# Patient Record
Sex: Female | Born: 2005 | Race: Black or African American | Hispanic: No | Marital: Single | Smoking: Never smoker
Health system: Southern US, Community
[De-identification: ages and names within clinical notes are randomized; demographics above are authoritative.]

## PROBLEM LIST (undated history)

## (undated) DIAGNOSIS — J45909 Unspecified asthma, uncomplicated: Secondary | ICD-10-CM

---
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
## 2023-06-08 ENCOUNTER — Encounter: Payer: Self-pay | Admitting: Certified Nurse Midwife

## 2023-06-08 ENCOUNTER — Ambulatory Visit: Admitting: Certified Nurse Midwife

## 2023-06-08 ENCOUNTER — Other Ambulatory Visit (HOSPITAL_COMMUNITY)
Admission: RE | Admit: 2023-06-08 | Discharge: 2023-06-08 | Disposition: A | Discharge status: Home / Self Care | Source: Ambulatory Visit | Attending: Certified Nurse Midwife | Admitting: Certified Nurse Midwife

## 2023-06-08 VITALS — BP 127/91 | HR 90 | Ht 64.0 in | Wt 216.0 lb

## 2023-06-08 DIAGNOSIS — Z01419 Encounter for gynecological examination (general) (routine) without abnormal findings: Secondary | ICD-10-CM

## 2023-06-08 DIAGNOSIS — Z113 Encounter for screening for infections with a predominantly sexual mode of transmission: Secondary | ICD-10-CM

## 2023-06-08 DIAGNOSIS — Z3009 Encounter for other general counseling and advice on contraception: Secondary | ICD-10-CM | POA: Diagnosis not present

## 2023-06-08 DIAGNOSIS — Z803 Family history of malignant neoplasm of breast: Secondary | ICD-10-CM

## 2023-06-08 DIAGNOSIS — Z7189 Other specified counseling: Secondary | ICD-10-CM | POA: Diagnosis not present

## 2023-06-08 NOTE — Progress Notes (Unsigned)
 HX OC  (Loestrin) use but not a constant user. Wants to discuss options. Wants STI testing today. Only swab not blood work. Will do self swab.

## 2023-06-09 ENCOUNTER — Telehealth: Payer: Self-pay | Admitting: Licensed Clinical Social Worker

## 2023-06-09 DIAGNOSIS — Z803 Family history of malignant neoplasm of breast: Secondary | ICD-10-CM | POA: Insufficient documentation

## 2023-06-09 NOTE — Telephone Encounter (Signed)
 [facility] contacted patient on this date to schedule [facility] appointment.Aaron Aas [facility] left a message.

## 2023-06-09 NOTE — Progress Notes (Signed)
 GYNECOLOGY OFFICE VISIT NOTE  History:    Casey Roth is a 18 y.o. G0P0000 here today for New GYN visit and discuss Birth control options. Patient is currently a Consulting civil engineer at [employer]. She is currently sexually active. Her menstrual period is ~21-25 days with a 5 day bleed. She reports using pads and tampons as her collection method and reports going through 5-7 per day. She endorses using Midol  with some relief for cramping during her cycles.  She denies problems with anemia previously. She reports previous use of the pill, but states that she was unable to take it consistently. She request STD testing today. She denies any abnormal vaginal discharge, bleeding, pelvic pain or other concerns.    Past Medical History:  Diagnosis Date   Asthma     History reviewed. No pertinent surgical history.  The following portions of the patient's history were reviewed and updated as appropriate: allergies, current medications, past family history, past medical history, past social history, past surgical history and problem list.   Health Maintenance:  Patient is not at recommended PAP smear age today. Plan to complete at age 61.  Review of Systems:  Pertinent items noted in HPI and remainder of comprehensive ROS otherwise negative.  Physical Exam:  BP (!) 127/91   Pulse 90   Ht 5\' 4"  (1.626 m)   Wt 216 lb (98 kg)   LMP 05/19/2023 (Exact Date)   BMI 37.08 kg/m  CONSTITUTIONAL: Well-developed, well-nourished female in no acute distress.  HEENT:  Normocephalic, atraumatic. External right and left ear normal. No scleral icterus.  NECK: Normal range of motion, supple, no masses noted on observation SKIN: No rash noted. Not diaphoretic. No erythema. No pallor. MUSCULOSKELETAL: Normal range of motion. No edema noted. NEUROLOGIC: Alert and oriented to person, place, and time. Normal muscle tone coordination. No cranial nerve deficit noted. PSYCHIATRIC: Normal mood and affect. Normal  behavior. Normal judgment and thought content. CARDIOVASCULAR: Normal heart rate noted RESPIRATORY: Effort and breath sounds normal, no problems with respiration noted ABDOMEN: No masses noted. No other overt distention noted.   PELVIC: Deferred  Labs and Imaging No results found for this or any previous visit (from the past week). No results found.    Assessment and Plan:    1. Encounter for gynecological examination without abnormal finding - Patient overall doing well.   2. Birth control counseling - Requesting birth control that she does not have to think about daily.  - Reviewed R/B/A of  LARC options.  - Through shared decision making patient and provider agree that a Nexplanon may be the best option.    3. Screening examination for STD (sexually transmitted disease) (Primary) - STD screening performed at patient request  - Cervicovaginal ancillary only( [place])  4. Encounter for breast self examination education - Discussed the importance of SBE. Patient reports breast cancer in paternal grandmother prior to the age of 104.  - Recommended SBE and discussed when and how to perform them at home.    Routine preventative health maintenance measures emphasized. Please refer to After Visit Summary for other counseling recommendations.   Return in about 1 week (around 06/15/2023) for Nexplanon insertion .    I spent 30 minutes dedicated to the care of this patient including pre-visit review of records, face to face time with the patient discussing her conditions and treatments and post visit orders.    Dereck Agerton Maurie Southern) Marlys Singh, MSN, CNM  [facility]  06/09/23 11:57  AM

## 2023-06-10 LAB — CERVICOVAGINAL ANCILLARY ONLY
Bacterial Vaginitis (gardnerella): NEGATIVE
Chlamydia: NEGATIVE
Comment: NEGATIVE
Comment: NEGATIVE
Comment: NEGATIVE
Comment: NORMAL
Neisseria Gonorrhea: NEGATIVE
Trichomonas: NEGATIVE

## 2023-07-09 ENCOUNTER — Ambulatory Visit: Admitting: Obstetrics and Gynecology

## 2023-07-30 IMAGING — CT CT HEAD W/O CM
4 series · 16 of 47 positions shown, 18 images · non-contrast
Comparison: None.

CLINICAL DATA: Head trauma, altered mental status (Ped 0-17y).

EXAM:
CT HEAD WITHOUT CONTRAST
TECHNIQUE: Contiguous axial images were obtained from the base of the skull
through the vertex without intravenous contrast.

[Series 2: head bone · axial · 0.43mm/px · z∈[-70,-38]mm · 3 of 80 slices shown]
[im 8/80  bone]
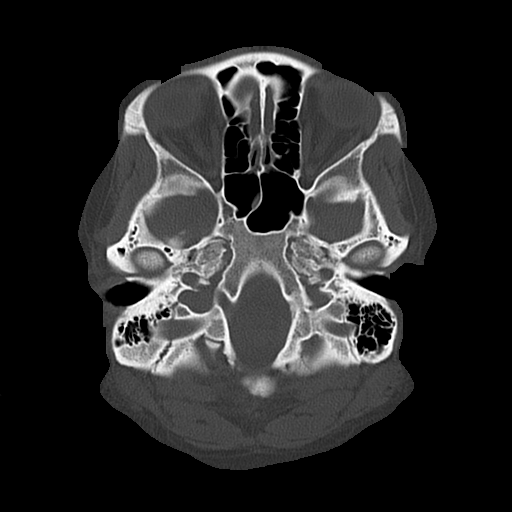
[im 16/80  bone]
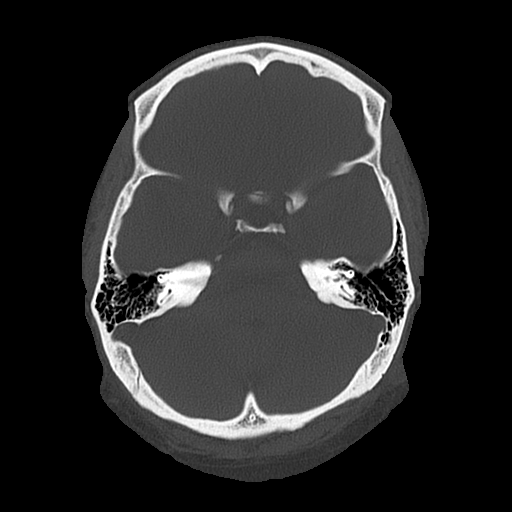
[im 24/80  bone]
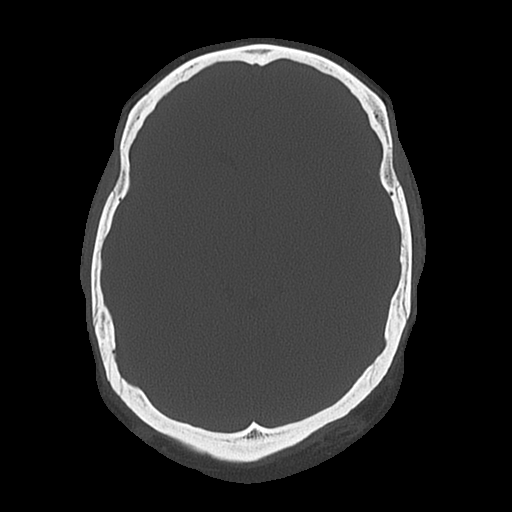

[Series 3: head wo · axial · 0.43mm/px · z∈[-69,+51]mm · 7 of 32 slices shown, 9 images]
[im 4/32  brain]
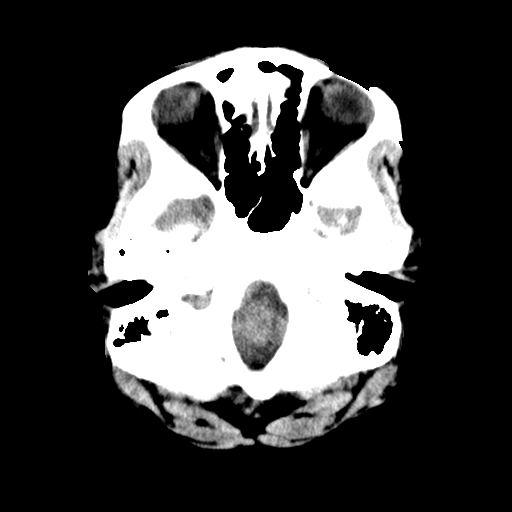
[im 4/32  bone]
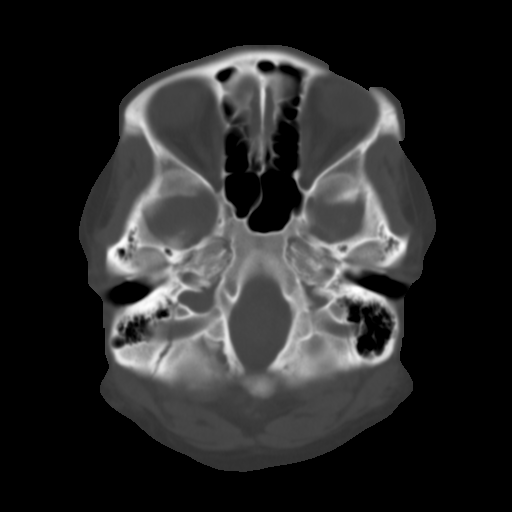
[im 8/32  brain]
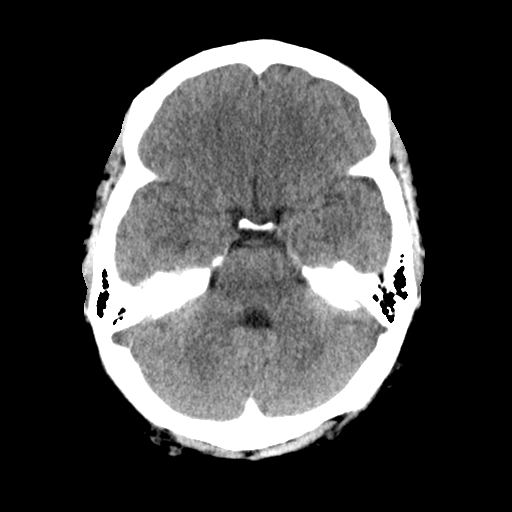
[im 12/32  brain]
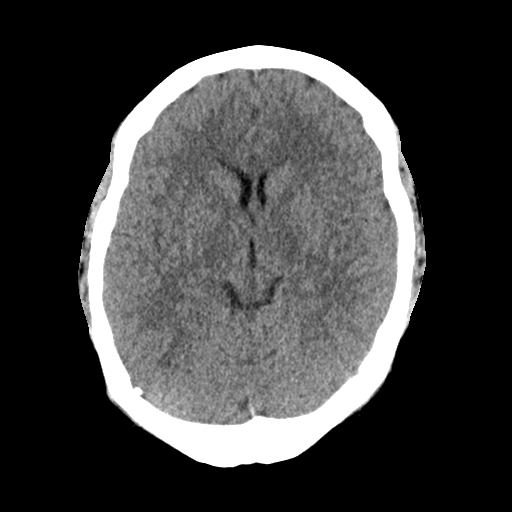
[im 16/32  brain]
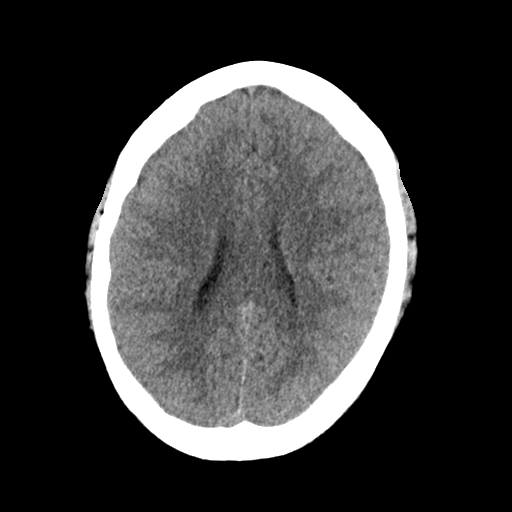
[im 20/32  brain]
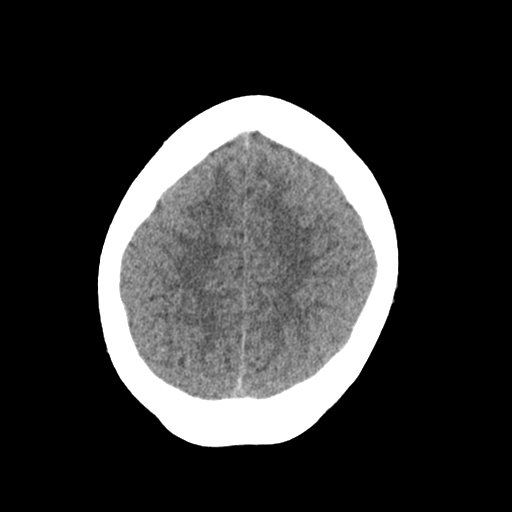
[im 20/32  bone]
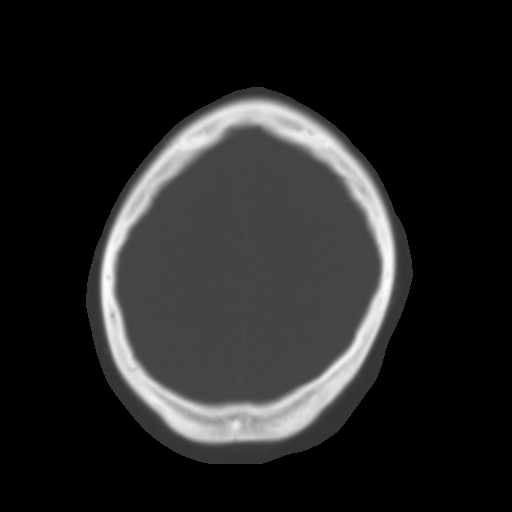
[im 24/32  brain]
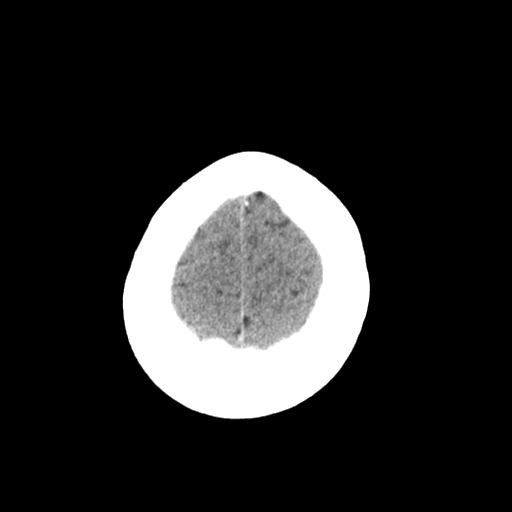
[im 28/32  brain]
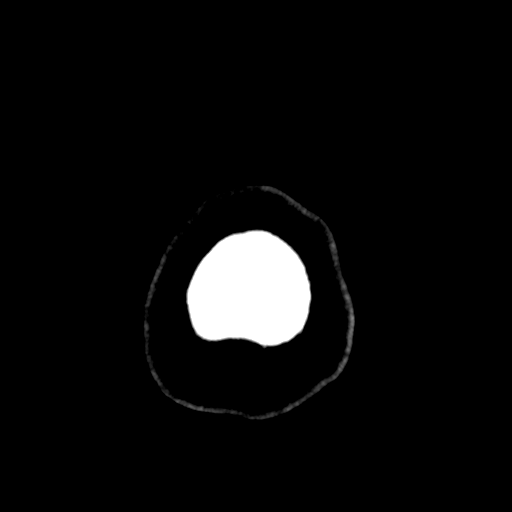

[Series 4: coronal soft · coronal · 0.29mm/px · 3 of 64 slices shown]
[im 22/64  brain]
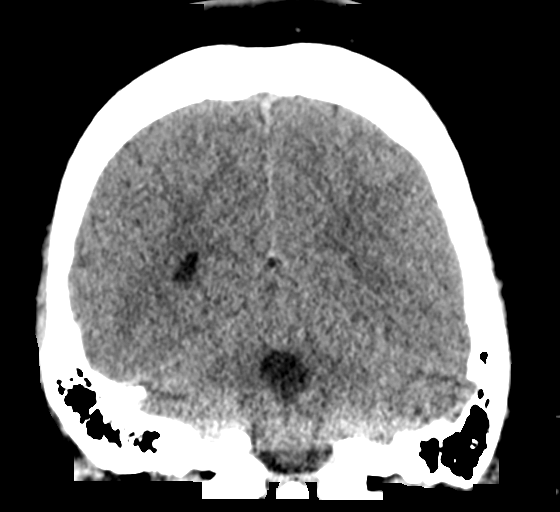
[im 29/64  brain]
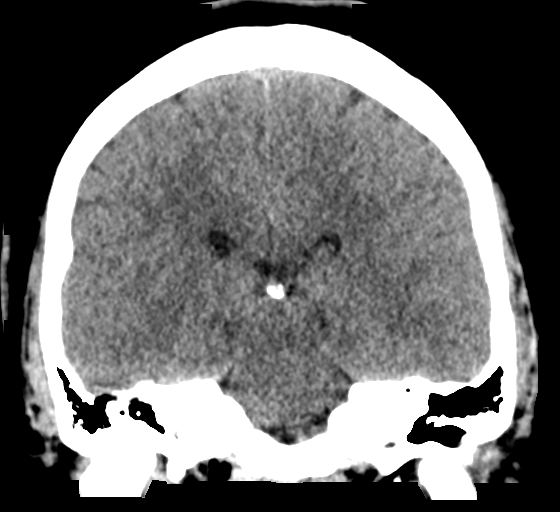
[im 36/64  brain]
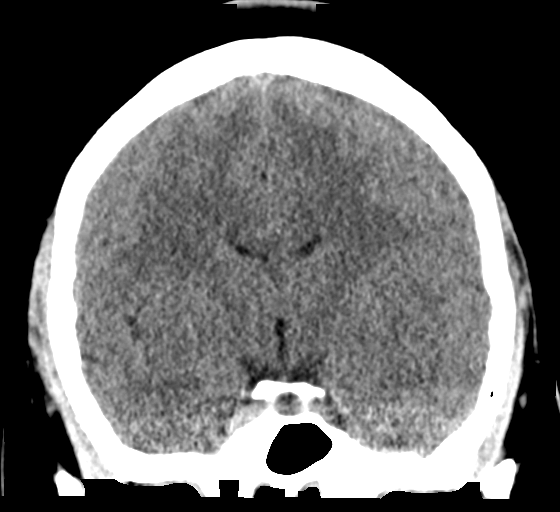

[Series 5: sagittal soft · sagittal · 0.29mm/px · 3 of 55 slices shown]
[im 19/55  brain]
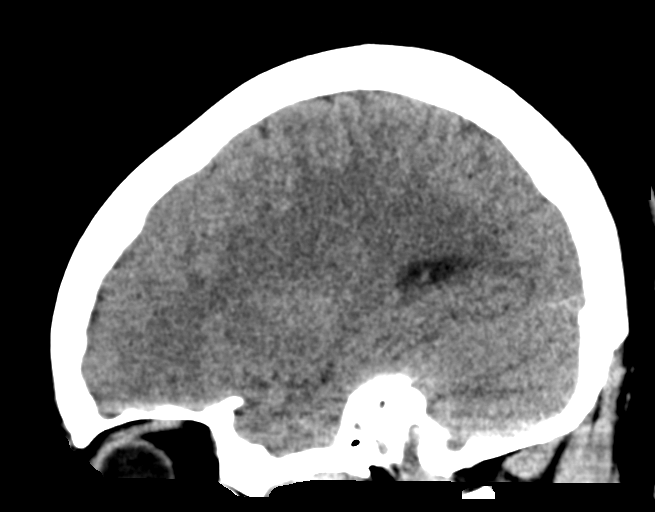
[im 28/55  brain]
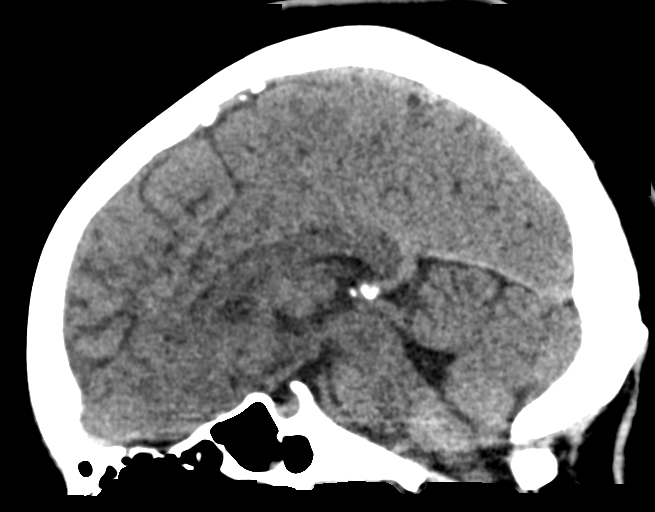
[im 37/55  brain]
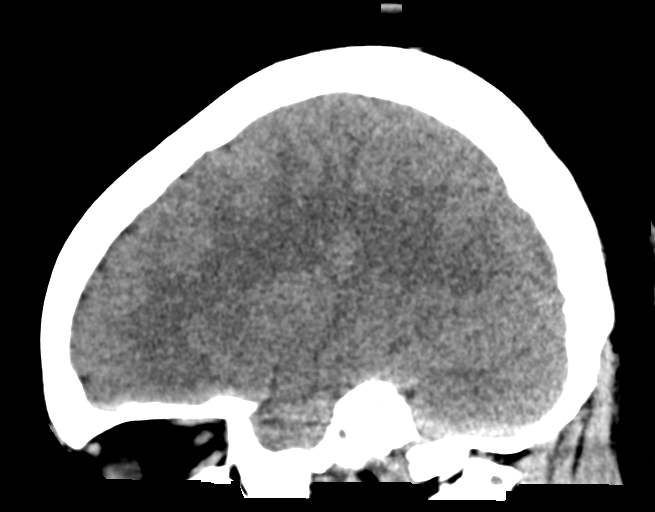

[16 of 47 positions shown; findings below may reference images not displayed]

FINDINGS: Brain: Normal anatomic configuration. No abnormal intra or
extra-axial mass lesion or fluid collection. No abnormal mass effect
or midline shift. No evidence of acute intracranial hemorrhage or
infarct. Ventricular size is normal. Cerebellum unremarkable.

Vascular: Unremarkable

Skull: Intact

Sinuses/Orbits: Small mucous retention cysts are noted within the
sphenoid sinuses and visualized left maxillary sinus. Remaining
paranasal sinuses are clear. Orbits are unremarkable.

Other: Mastoid air cells and middle ear cavities are clear.
IMPRESSION: No acute intracranial abnormality.  No calvarial fracture.

Mild paranasal sinus disease.

## 2023-08-11 ENCOUNTER — Ambulatory Visit: Admitting: Physician Assistant

## 2023-08-11 ENCOUNTER — Other Ambulatory Visit (HOSPITAL_COMMUNITY)
Admission: RE | Admit: 2023-08-11 | Discharge: 2023-08-11 | Disposition: A | Discharge status: Home / Self Care | Source: Ambulatory Visit | Attending: Physician Assistant | Admitting: Physician Assistant

## 2023-08-11 ENCOUNTER — Encounter: Payer: Self-pay | Admitting: Physician Assistant

## 2023-08-11 VITALS — BP 130/82 | HR 84 | Ht 64.0 in | Wt 216.2 lb

## 2023-08-11 DIAGNOSIS — Z113 Encounter for screening for infections with a predominantly sexual mode of transmission: Secondary | ICD-10-CM

## 2023-08-11 DIAGNOSIS — Z30017 Encounter for initial prescription of implantable subdermal contraceptive: Secondary | ICD-10-CM | POA: Diagnosis present

## 2023-08-11 MED ORDER — ETONOGESTREL 68 MG ~~LOC~~ IMPL
68.0000 mg | DRUG_IMPLANT | Freq: Once | SUBCUTANEOUS | Status: AC
Start: 2023-08-11 — End: 2023-08-11
  Administered 2023-08-11: 68 mg via SUBCUTANEOUS

## 2023-08-11 NOTE — [facility] Notes (Signed)
 GYNECOLOGY  VISIT   HPI: Casey Roth is a 18 y.o.   single female [id number] here for Nexplanon  insertion and routine STI testing without concern for exposure.     GYNECOLOGIC HISTORY: Patient's last menstrual period was 08/06/2023. Contraception: none.   Menopausal hormone therapy:  Premenopausal Last mammogram:  Never previously done due to age Last pap smear: Never previously done due to age        18 History     Gravida  0   Para  0   Term  0   Preterm  0   AB  0   Living  0      SAB  0   IAB  0   Ectopic  0   Multiple  0   Live Births  0              Patient Active Problem List   Diagnosis Date Noted   Family history of breast cancer 06/09/2023   Obesity, morbid (HCC) 06/24/2017   Acanthosis 06/24/2017   Abnormal food appetite 06/24/2017   Family history of type 2 diabetes mellitus 06/24/2017    Past Medical History:  Diagnosis Date   Asthma     History reviewed. No pertinent surgical history.  Current Outpatient Medications  Medication Sig Dispense Refill   albuterol  (PROVENTIL  HFA;VENTOLIN  HFA) 108 (90 BASE) MCG/ACT inhaler Inhale 2 puffs into the lungs every 6 (six) hours as needed for wheezing.     montelukast (SINGULAIR) 10 MG tablet Take 10 mg by mouth at bedtime.     brompheniramine-pseudoephedrine-DM 30-2-10 MG/5ML syrup Take 5 mLs by mouth 4 (four) times daily as needed. (Patient not taking: Reported on 08/01/2019) 120 mL 0   Cetirizine  HCl 10 MG CAPS Take 1 capsule (10 mg total) by mouth daily for 10 days. (Patient taking differently: Take 10 mg by mouth at bedtime. ) 10 capsule 0   fluticasone  (FLONASE ) 50 MCG/ACT nasal spray Place 1-2 sprays into both nostrils daily for 7 days. (Patient not taking: Reported on 08/01/2019) 1 g 0   ibuprofen  (ADVIL ) 400 MG tablet Take 1 tablet (400 mg total) by mouth every 6 (six) hours as needed. (Patient not taking: Reported on 06/08/2023) 30 tablet 0   ondansetron  (ZOFRAN  ODT) 4 MG disintegrating  tablet Take 1 tablet (4 mg total) by mouth every 8 (eight) hours as needed for nausea or vomiting. (Patient not taking: Reported on 08/01/2019) 12 tablet 0   No current facility-administered medications for this visit.     ALLERGIES: Peanut-containing drug products and Shrimp [shellfish allergy]  Family History  Problem Relation Age of Onset   Diabetes Father    Hypertension Father    Hypercholesterolemia Father    Diabetes Paternal Grandmother    Hypertension Paternal Grandmother    Diabetes Paternal Grandfather    Hypertension Paternal Grandfather    Thyroid disease Neg Hx     Social History   Socioeconomic History   Marital status: Single    Spouse name: Not on file   Number of children: Not on file   Years of education: Not on file   Highest education level: Not on file  Occupational History   Not on file  Tobacco Use   Smoking status: Never    Passive exposure: Yes   Smokeless tobacco: Never  Vaping Use   Vaping status: Never Used  Substance and Sexual Activity   Alcohol use: No   Drug use: No   Sexual activity: Yes  Partners: Male    Birth control/protection: Condom  Other Topics Concern   Not on file  Social History Narrative   8th grade at [employer]   Social Drivers of Health   Financial Resource Strain: Not on file  Food Insecurity: Not on file  Transportation Needs: Not on file  Physical Activity: Not on file  Stress: Not on file  Social Connections: Not on file  Intimate Partner Violence: Not on file    Review of Systems  PHYSICAL EXAMINATION:    BP 130/82   Pulse 84   Ht 5' 4 (1.626 m)   Wt 216 lb 3.2 oz (98.1 kg)   LMP 08/06/2023   BMI 37.11 kg/m     General appearance: alert, cooperative and appears stated age Head: Normocephalic, without obvious abnormality, atraumatic Neck: no adenopathy, supple, symmetrical, trachea midline and thyroid normal to inspection and palpation Lungs: clear to auscultation  bilaterally Breasts: normal appearance, no masses or tenderness, No nipple retraction or dimpling, No nipple discharge or bleeding, No axillary or supraclavicular adenopathy Heart: regular rate and rhythm Abdomen: soft, non-tender, no masses,  no organomegaly Extremities: extremities normal, atraumatic, no cyanosis or edema Skin: Skin color, texture, turgor normal. No rashes or lesions Lymph nodes: Cervical, supraclavicular, and axillary nodes normal. No abnormal inguinal nodes palpated Neurologic: Grossly normal  Nexplanon  Insertion Procedure Patient identified, informed consent performed, consent signed. Patient does understand that irregular bleeding is a very common side effect of this medication. She was advised to have backup contraception for one week after placement. Pregnancy test in clinic today was negative.  Appropriate time out taken.    Patient's left arm was prepped and draped in the usual sterile fashion. The ruler used to measure and mark insertion area.  Patient was prepped with alcohol swab and then injected with 3 ml of 1% lidocaine.  She was prepped with betadine, Nexplanon  removed from packaging,  Device confirmed in needle, then inserted full length of needle and withdrawn per handbook instructions. Nexplanon  was able to palpated in the patient's arm; patient palpated the insert herself. There was minimal blood loss.  Patient insertion site covered with guaze and a pressure bandage to reduce any bruising.    The patient tolerated the procedure well and was given post procedure instructions.   Patient was given post procedure instructions and Nexplanon  user card with expiration date. Condoms were recommended for STI prevention. Patient was asked to keep the pressure dressing on for 24 hours to minimize bruising and keep the adhesive bandage on for 3-5 days. The patient verbalized understanding of the plan of care and agrees.   ASSESSMENT & PLAN   1. Encounter for initial  prescription of implantable subdermal contraceptive (Primary) - POCT urine pregnancy  2. Routine screening for STI (sexually transmitted infection) - Cervicovaginal ancillary only( [place]) - Hepatitis B Surface AntiGEN - Hepatitis C Antibody - HIV antibody (with reflex) - RPR    An After Visit Summary was printed and given to the patient.  Samaiya Awadallah E Suzanne Kho, PA-C 7/30/20257:20 PM

## 2023-08-11 NOTE — Patient Instructions (Signed)
 Nexplanon  aftercare: Leave the top bandage on for 24 hours. Keep the smaller bandage clean, dry, and in place for 3 to 5 days.  Call our office immediately if you have:  -Pain in your lower leg that does not go away -Severe chest pain or heaviness in your chest -Sudden shortness of breath -Sharp chest pain coughing blood -Symptoms of severe allergic reaction such as a swollen face, tongue, or throat, or have trouble breathing or swallowing -Sudden severe headache, unlike her usual headaches -Weakness or numbness in your arms or legs or trouble speaking -Sudden blindness -Either partial or complete yellowing of your skin or whites of your eyes, especially with fever, tiredness, loss of appetite, dark-colored urine, or light-colored bowel movements -Severe pain, swelling or tenderness in the lower stomach -Lump in your breast -Problems sleeping, or lack of energy, tiredness, or if you feel very sad -Heavy menstrual bleeding, heavier than usual -Suspect the implant is broken or bent while in your arm

## 2023-08-11 NOTE — [facility] Notes (Signed)
 Here for Nexplanon . Has questions about menses flow with device for provider.

## 2023-08-12 LAB — CERVICOVAGINAL ANCILLARY ONLY
Bacterial Vaginitis (gardnerella): NEGATIVE
Candida Glabrata: NEGATIVE
Candida Vaginitis: NEGATIVE
Chlamydia: NEGATIVE
Comment: NEGATIVE
Comment: NEGATIVE
Comment: NEGATIVE
Comment: NEGATIVE
Comment: NEGATIVE
Comment: NORMAL
Neisseria Gonorrhea: NEGATIVE
Trichomonas: NEGATIVE

## 2023-08-12 LAB — HIV ANTIBODY (ROUTINE TESTING W REFLEX): HIV Screen 4th Generation wRfx: NONREACTIVE

## 2023-08-12 LAB — HEPATITIS C ANTIBODY: Hep C Virus Ab: NONREACTIVE

## 2023-08-12 LAB — HEPATITIS B SURFACE ANTIGEN: Hepatitis B Surface Ag: NEGATIVE

## 2023-08-12 LAB — RPR: RPR Ser Ql: NONREACTIVE

## 2023-08-15 ENCOUNTER — Ambulatory Visit: Payer: Self-pay | Admitting: Physician Assistant
